# Patient Record
Sex: Male | Born: 2000 | Race: White | Hispanic: No | Marital: Single | Smoking: Never smoker
Health system: Southern US, Community
[De-identification: ages and names within clinical notes are randomized; demographics above are authoritative.]

## PROBLEM LIST (undated history)

## (undated) DIAGNOSIS — S060XAA Concussion with loss of consciousness status unknown, initial encounter: Secondary | ICD-10-CM

## (undated) DIAGNOSIS — S060X9A Concussion with loss of consciousness of unspecified duration, initial encounter: Secondary | ICD-10-CM

---
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
## 2022-08-17 DIAGNOSIS — S6991XA Unspecified injury of right wrist, hand and finger(s), initial encounter: Secondary | ICD-10-CM | POA: Diagnosis not present

## 2022-08-18 DIAGNOSIS — M79644 Pain in right finger(s): Secondary | ICD-10-CM | POA: Diagnosis not present

## 2022-08-31 DIAGNOSIS — M79644 Pain in right finger(s): Secondary | ICD-10-CM | POA: Diagnosis not present

## 2022-09-28 DIAGNOSIS — B349 Viral infection, unspecified: Secondary | ICD-10-CM | POA: Diagnosis not present

## 2022-10-15 DIAGNOSIS — F411 Generalized anxiety disorder: Secondary | ICD-10-CM | POA: Diagnosis not present

## 2022-10-15 DIAGNOSIS — G479 Sleep disorder, unspecified: Secondary | ICD-10-CM | POA: Diagnosis not present

## 2022-10-15 DIAGNOSIS — R41840 Attention and concentration deficit: Secondary | ICD-10-CM | POA: Diagnosis not present

## 2022-10-15 DIAGNOSIS — Z23 Encounter for immunization: Secondary | ICD-10-CM | POA: Diagnosis not present

## 2022-11-05 DIAGNOSIS — J101 Influenza due to other identified influenza virus with other respiratory manifestations: Secondary | ICD-10-CM | POA: Diagnosis not present

## 2022-11-05 DIAGNOSIS — J069 Acute upper respiratory infection, unspecified: Secondary | ICD-10-CM | POA: Diagnosis not present

## 2022-11-05 DIAGNOSIS — Z03818 Encounter for observation for suspected exposure to other biological agents ruled out: Secondary | ICD-10-CM | POA: Diagnosis not present

## 2022-11-19 DIAGNOSIS — J208 Acute bronchitis due to other specified organisms: Secondary | ICD-10-CM | POA: Diagnosis not present

## 2022-11-26 DIAGNOSIS — G479 Sleep disorder, unspecified: Secondary | ICD-10-CM | POA: Diagnosis not present

## 2022-11-26 DIAGNOSIS — R41840 Attention and concentration deficit: Secondary | ICD-10-CM | POA: Diagnosis not present

## 2022-11-26 DIAGNOSIS — Z13220 Encounter for screening for lipoid disorders: Secondary | ICD-10-CM | POA: Diagnosis not present

## 2022-11-26 DIAGNOSIS — F411 Generalized anxiety disorder: Secondary | ICD-10-CM | POA: Diagnosis not present

## 2022-11-26 DIAGNOSIS — Z0000 Encounter for general adult medical examination without abnormal findings: Secondary | ICD-10-CM | POA: Diagnosis not present

## 2022-11-26 DIAGNOSIS — R059 Cough, unspecified: Secondary | ICD-10-CM | POA: Diagnosis not present

## 2022-12-28 DIAGNOSIS — Z79891 Long term (current) use of opiate analgesic: Secondary | ICD-10-CM | POA: Diagnosis not present

## 2022-12-28 DIAGNOSIS — Z79899 Other long term (current) drug therapy: Secondary | ICD-10-CM | POA: Diagnosis not present

## 2022-12-28 DIAGNOSIS — Z5181 Encounter for therapeutic drug level monitoring: Secondary | ICD-10-CM | POA: Diagnosis not present

## 2022-12-28 DIAGNOSIS — R41840 Attention and concentration deficit: Secondary | ICD-10-CM | POA: Diagnosis not present

## 2022-12-31 DIAGNOSIS — F330 Major depressive disorder, recurrent, mild: Secondary | ICD-10-CM | POA: Diagnosis not present

## 2022-12-31 DIAGNOSIS — F902 Attention-deficit hyperactivity disorder, combined type: Secondary | ICD-10-CM | POA: Diagnosis not present

## 2022-12-31 DIAGNOSIS — F419 Anxiety disorder, unspecified: Secondary | ICD-10-CM | POA: Diagnosis not present

## 2023-01-04 DIAGNOSIS — R41840 Attention and concentration deficit: Secondary | ICD-10-CM | POA: Diagnosis not present

## 2023-01-04 DIAGNOSIS — Z79899 Other long term (current) drug therapy: Secondary | ICD-10-CM | POA: Diagnosis not present

## 2023-01-05 DIAGNOSIS — F429 Obsessive-compulsive disorder, unspecified: Secondary | ICD-10-CM | POA: Diagnosis not present

## 2023-01-07 DIAGNOSIS — F419 Anxiety disorder, unspecified: Secondary | ICD-10-CM | POA: Diagnosis not present

## 2023-01-07 DIAGNOSIS — F902 Attention-deficit hyperactivity disorder, combined type: Secondary | ICD-10-CM | POA: Diagnosis not present

## 2023-01-12 DIAGNOSIS — F429 Obsessive-compulsive disorder, unspecified: Secondary | ICD-10-CM | POA: Diagnosis not present

## 2023-01-12 DIAGNOSIS — F3181 Bipolar II disorder: Secondary | ICD-10-CM | POA: Diagnosis not present

## 2023-01-14 DIAGNOSIS — F902 Attention-deficit hyperactivity disorder, combined type: Secondary | ICD-10-CM | POA: Diagnosis not present

## 2023-01-14 DIAGNOSIS — F419 Anxiety disorder, unspecified: Secondary | ICD-10-CM | POA: Diagnosis not present

## 2023-01-21 DIAGNOSIS — F429 Obsessive-compulsive disorder, unspecified: Secondary | ICD-10-CM | POA: Diagnosis not present

## 2023-01-21 DIAGNOSIS — F3181 Bipolar II disorder: Secondary | ICD-10-CM | POA: Diagnosis not present

## 2023-01-21 DIAGNOSIS — F419 Anxiety disorder, unspecified: Secondary | ICD-10-CM | POA: Diagnosis not present

## 2023-01-21 DIAGNOSIS — F902 Attention-deficit hyperactivity disorder, combined type: Secondary | ICD-10-CM | POA: Diagnosis not present

## 2023-02-04 DIAGNOSIS — F3181 Bipolar II disorder: Secondary | ICD-10-CM | POA: Diagnosis not present

## 2023-02-04 DIAGNOSIS — F331 Major depressive disorder, recurrent, moderate: Secondary | ICD-10-CM | POA: Diagnosis not present

## 2023-02-04 DIAGNOSIS — F902 Attention-deficit hyperactivity disorder, combined type: Secondary | ICD-10-CM | POA: Diagnosis not present

## 2023-02-04 DIAGNOSIS — F429 Obsessive-compulsive disorder, unspecified: Secondary | ICD-10-CM | POA: Diagnosis not present

## 2023-02-06 DIAGNOSIS — S01112A Laceration without foreign body of left eyelid and periocular area, initial encounter: Secondary | ICD-10-CM | POA: Diagnosis not present

## 2023-02-08 DIAGNOSIS — F3181 Bipolar II disorder: Secondary | ICD-10-CM | POA: Diagnosis not present

## 2023-02-08 DIAGNOSIS — Z1331 Encounter for screening for depression: Secondary | ICD-10-CM | POA: Diagnosis not present

## 2023-02-08 DIAGNOSIS — F4310 Post-traumatic stress disorder, unspecified: Secondary | ICD-10-CM | POA: Diagnosis not present

## 2023-02-08 DIAGNOSIS — F429 Obsessive-compulsive disorder, unspecified: Secondary | ICD-10-CM | POA: Diagnosis not present

## 2023-02-18 DIAGNOSIS — F429 Obsessive-compulsive disorder, unspecified: Secondary | ICD-10-CM | POA: Diagnosis not present

## 2023-02-18 DIAGNOSIS — F3181 Bipolar II disorder: Secondary | ICD-10-CM | POA: Diagnosis not present

## 2023-02-18 DIAGNOSIS — F902 Attention-deficit hyperactivity disorder, combined type: Secondary | ICD-10-CM | POA: Diagnosis not present

## 2023-02-18 DIAGNOSIS — F4310 Post-traumatic stress disorder, unspecified: Secondary | ICD-10-CM | POA: Diagnosis not present

## 2023-02-25 DIAGNOSIS — F4310 Post-traumatic stress disorder, unspecified: Secondary | ICD-10-CM | POA: Diagnosis not present

## 2023-02-25 DIAGNOSIS — F3181 Bipolar II disorder: Secondary | ICD-10-CM | POA: Diagnosis not present

## 2023-02-25 DIAGNOSIS — F902 Attention-deficit hyperactivity disorder, combined type: Secondary | ICD-10-CM | POA: Diagnosis not present

## 2023-02-25 DIAGNOSIS — F429 Obsessive-compulsive disorder, unspecified: Secondary | ICD-10-CM | POA: Diagnosis not present

## 2023-03-04 DIAGNOSIS — F902 Attention-deficit hyperactivity disorder, combined type: Secondary | ICD-10-CM | POA: Diagnosis not present

## 2023-03-04 DIAGNOSIS — S91331A Puncture wound without foreign body, right foot, initial encounter: Secondary | ICD-10-CM | POA: Diagnosis not present

## 2023-03-04 DIAGNOSIS — F3181 Bipolar II disorder: Secondary | ICD-10-CM | POA: Diagnosis not present

## 2023-03-08 DIAGNOSIS — F429 Obsessive-compulsive disorder, unspecified: Secondary | ICD-10-CM | POA: Diagnosis not present

## 2023-03-08 DIAGNOSIS — F3181 Bipolar II disorder: Secondary | ICD-10-CM | POA: Diagnosis not present

## 2023-03-08 DIAGNOSIS — Z1331 Encounter for screening for depression: Secondary | ICD-10-CM | POA: Diagnosis not present

## 2023-03-08 DIAGNOSIS — F4310 Post-traumatic stress disorder, unspecified: Secondary | ICD-10-CM | POA: Diagnosis not present

## 2023-03-11 DIAGNOSIS — F331 Major depressive disorder, recurrent, moderate: Secondary | ICD-10-CM | POA: Diagnosis not present

## 2023-03-11 DIAGNOSIS — F902 Attention-deficit hyperactivity disorder, combined type: Secondary | ICD-10-CM | POA: Diagnosis not present

## 2023-03-11 DIAGNOSIS — F429 Obsessive-compulsive disorder, unspecified: Secondary | ICD-10-CM | POA: Diagnosis not present

## 2023-03-18 DIAGNOSIS — F3181 Bipolar II disorder: Secondary | ICD-10-CM | POA: Diagnosis not present

## 2023-03-18 DIAGNOSIS — F902 Attention-deficit hyperactivity disorder, combined type: Secondary | ICD-10-CM | POA: Diagnosis not present

## 2023-03-18 DIAGNOSIS — F429 Obsessive-compulsive disorder, unspecified: Secondary | ICD-10-CM | POA: Diagnosis not present

## 2023-03-18 DIAGNOSIS — F4310 Post-traumatic stress disorder, unspecified: Secondary | ICD-10-CM | POA: Diagnosis not present

## 2023-03-22 DIAGNOSIS — R030 Elevated blood-pressure reading, without diagnosis of hypertension: Secondary | ICD-10-CM | POA: Diagnosis not present

## 2023-03-22 DIAGNOSIS — L089 Local infection of the skin and subcutaneous tissue, unspecified: Secondary | ICD-10-CM | POA: Diagnosis not present

## 2023-03-23 DIAGNOSIS — L03115 Cellulitis of right lower limb: Secondary | ICD-10-CM | POA: Diagnosis not present

## 2023-03-25 DIAGNOSIS — F4310 Post-traumatic stress disorder, unspecified: Secondary | ICD-10-CM | POA: Diagnosis not present

## 2023-03-25 DIAGNOSIS — F902 Attention-deficit hyperactivity disorder, combined type: Secondary | ICD-10-CM | POA: Diagnosis not present

## 2023-03-25 DIAGNOSIS — F429 Obsessive-compulsive disorder, unspecified: Secondary | ICD-10-CM | POA: Diagnosis not present

## 2023-03-25 DIAGNOSIS — F3181 Bipolar II disorder: Secondary | ICD-10-CM | POA: Diagnosis not present

## 2023-04-01 DIAGNOSIS — F3181 Bipolar II disorder: Secondary | ICD-10-CM | POA: Diagnosis not present

## 2023-04-01 DIAGNOSIS — F902 Attention-deficit hyperactivity disorder, combined type: Secondary | ICD-10-CM | POA: Diagnosis not present

## 2023-04-01 DIAGNOSIS — F429 Obsessive-compulsive disorder, unspecified: Secondary | ICD-10-CM | POA: Diagnosis not present

## 2023-04-01 DIAGNOSIS — F4310 Post-traumatic stress disorder, unspecified: Secondary | ICD-10-CM | POA: Diagnosis not present

## 2023-04-02 DIAGNOSIS — M9902 Segmental and somatic dysfunction of thoracic region: Secondary | ICD-10-CM | POA: Diagnosis not present

## 2023-04-02 DIAGNOSIS — M7651 Patellar tendinitis, right knee: Secondary | ICD-10-CM | POA: Diagnosis not present

## 2023-04-02 DIAGNOSIS — M9905 Segmental and somatic dysfunction of pelvic region: Secondary | ICD-10-CM | POA: Diagnosis not present

## 2023-04-02 DIAGNOSIS — M9903 Segmental and somatic dysfunction of lumbar region: Secondary | ICD-10-CM | POA: Diagnosis not present

## 2023-04-02 DIAGNOSIS — M7661 Achilles tendinitis, right leg: Secondary | ICD-10-CM | POA: Diagnosis not present

## 2023-04-02 DIAGNOSIS — M4607 Spinal enthesopathy, lumbosacral region: Secondary | ICD-10-CM | POA: Diagnosis not present

## 2023-04-06 DIAGNOSIS — F3181 Bipolar II disorder: Secondary | ICD-10-CM | POA: Diagnosis not present

## 2023-04-06 DIAGNOSIS — F4310 Post-traumatic stress disorder, unspecified: Secondary | ICD-10-CM | POA: Diagnosis not present

## 2023-04-06 DIAGNOSIS — Z1331 Encounter for screening for depression: Secondary | ICD-10-CM | POA: Diagnosis not present

## 2023-04-06 DIAGNOSIS — F429 Obsessive-compulsive disorder, unspecified: Secondary | ICD-10-CM | POA: Diagnosis not present

## 2023-04-07 DIAGNOSIS — M4607 Spinal enthesopathy, lumbosacral region: Secondary | ICD-10-CM | POA: Diagnosis not present

## 2023-04-07 DIAGNOSIS — M9906 Segmental and somatic dysfunction of lower extremity: Secondary | ICD-10-CM | POA: Diagnosis not present

## 2023-04-07 DIAGNOSIS — M9903 Segmental and somatic dysfunction of lumbar region: Secondary | ICD-10-CM | POA: Diagnosis not present

## 2023-04-07 DIAGNOSIS — M9905 Segmental and somatic dysfunction of pelvic region: Secondary | ICD-10-CM | POA: Diagnosis not present

## 2023-04-07 DIAGNOSIS — M9902 Segmental and somatic dysfunction of thoracic region: Secondary | ICD-10-CM | POA: Diagnosis not present

## 2023-04-08 DIAGNOSIS — F429 Obsessive-compulsive disorder, unspecified: Secondary | ICD-10-CM | POA: Diagnosis not present

## 2023-04-08 DIAGNOSIS — F902 Attention-deficit hyperactivity disorder, combined type: Secondary | ICD-10-CM | POA: Diagnosis not present

## 2023-04-08 DIAGNOSIS — F4310 Post-traumatic stress disorder, unspecified: Secondary | ICD-10-CM | POA: Diagnosis not present

## 2023-04-08 DIAGNOSIS — F3181 Bipolar II disorder: Secondary | ICD-10-CM | POA: Diagnosis not present

## 2023-04-15 DIAGNOSIS — F4310 Post-traumatic stress disorder, unspecified: Secondary | ICD-10-CM | POA: Diagnosis not present

## 2023-04-15 DIAGNOSIS — F429 Obsessive-compulsive disorder, unspecified: Secondary | ICD-10-CM | POA: Diagnosis not present

## 2023-04-15 DIAGNOSIS — F3181 Bipolar II disorder: Secondary | ICD-10-CM | POA: Diagnosis not present

## 2023-04-15 DIAGNOSIS — F902 Attention-deficit hyperactivity disorder, combined type: Secondary | ICD-10-CM | POA: Diagnosis not present

## 2023-04-15 DIAGNOSIS — L02415 Cutaneous abscess of right lower limb: Secondary | ICD-10-CM | POA: Diagnosis not present

## 2023-04-15 DIAGNOSIS — Z22322 Carrier or suspected carrier of Methicillin resistant Staphylococcus aureus: Secondary | ICD-10-CM | POA: Diagnosis not present

## 2023-04-16 DIAGNOSIS — M9906 Segmental and somatic dysfunction of lower extremity: Secondary | ICD-10-CM | POA: Diagnosis not present

## 2023-04-16 DIAGNOSIS — M9903 Segmental and somatic dysfunction of lumbar region: Secondary | ICD-10-CM | POA: Diagnosis not present

## 2023-04-16 DIAGNOSIS — M4607 Spinal enthesopathy, lumbosacral region: Secondary | ICD-10-CM | POA: Diagnosis not present

## 2023-04-16 DIAGNOSIS — M9902 Segmental and somatic dysfunction of thoracic region: Secondary | ICD-10-CM | POA: Diagnosis not present

## 2023-04-16 DIAGNOSIS — M9905 Segmental and somatic dysfunction of pelvic region: Secondary | ICD-10-CM | POA: Diagnosis not present

## 2023-04-22 DIAGNOSIS — F902 Attention-deficit hyperactivity disorder, combined type: Secondary | ICD-10-CM | POA: Diagnosis not present

## 2023-04-22 DIAGNOSIS — F429 Obsessive-compulsive disorder, unspecified: Secondary | ICD-10-CM | POA: Diagnosis not present

## 2023-04-22 DIAGNOSIS — F3181 Bipolar II disorder: Secondary | ICD-10-CM | POA: Diagnosis not present

## 2023-04-23 DIAGNOSIS — M9905 Segmental and somatic dysfunction of pelvic region: Secondary | ICD-10-CM | POA: Diagnosis not present

## 2023-04-23 DIAGNOSIS — M9906 Segmental and somatic dysfunction of lower extremity: Secondary | ICD-10-CM | POA: Diagnosis not present

## 2023-04-23 DIAGNOSIS — M9903 Segmental and somatic dysfunction of lumbar region: Secondary | ICD-10-CM | POA: Diagnosis not present

## 2023-04-23 DIAGNOSIS — M9901 Segmental and somatic dysfunction of cervical region: Secondary | ICD-10-CM | POA: Diagnosis not present

## 2023-04-23 DIAGNOSIS — M4607 Spinal enthesopathy, lumbosacral region: Secondary | ICD-10-CM | POA: Diagnosis not present

## 2023-04-23 DIAGNOSIS — M9902 Segmental and somatic dysfunction of thoracic region: Secondary | ICD-10-CM | POA: Diagnosis not present

## 2023-04-27 DIAGNOSIS — F411 Generalized anxiety disorder: Secondary | ICD-10-CM | POA: Diagnosis not present

## 2023-04-27 DIAGNOSIS — F515 Nightmare disorder: Secondary | ICD-10-CM | POA: Diagnosis not present

## 2023-04-27 DIAGNOSIS — F319 Bipolar disorder, unspecified: Secondary | ICD-10-CM | POA: Diagnosis not present

## 2023-04-29 DIAGNOSIS — F3181 Bipolar II disorder: Secondary | ICD-10-CM | POA: Diagnosis not present

## 2023-04-29 DIAGNOSIS — F4310 Post-traumatic stress disorder, unspecified: Secondary | ICD-10-CM | POA: Diagnosis not present

## 2023-04-29 DIAGNOSIS — R944 Abnormal results of kidney function studies: Secondary | ICD-10-CM | POA: Diagnosis not present

## 2023-04-29 DIAGNOSIS — F429 Obsessive-compulsive disorder, unspecified: Secondary | ICD-10-CM | POA: Diagnosis not present

## 2023-04-29 DIAGNOSIS — F902 Attention-deficit hyperactivity disorder, combined type: Secondary | ICD-10-CM | POA: Diagnosis not present

## 2023-05-03 DIAGNOSIS — F429 Obsessive-compulsive disorder, unspecified: Secondary | ICD-10-CM | POA: Diagnosis not present

## 2023-05-03 DIAGNOSIS — F3181 Bipolar II disorder: Secondary | ICD-10-CM | POA: Diagnosis not present

## 2023-05-03 DIAGNOSIS — F4310 Post-traumatic stress disorder, unspecified: Secondary | ICD-10-CM | POA: Diagnosis not present

## 2023-05-07 DIAGNOSIS — M9903 Segmental and somatic dysfunction of lumbar region: Secondary | ICD-10-CM | POA: Diagnosis not present

## 2023-05-07 DIAGNOSIS — M9902 Segmental and somatic dysfunction of thoracic region: Secondary | ICD-10-CM | POA: Diagnosis not present

## 2023-05-07 DIAGNOSIS — M4607 Spinal enthesopathy, lumbosacral region: Secondary | ICD-10-CM | POA: Diagnosis not present

## 2023-05-07 DIAGNOSIS — M9905 Segmental and somatic dysfunction of pelvic region: Secondary | ICD-10-CM | POA: Diagnosis not present

## 2023-05-27 DIAGNOSIS — F3181 Bipolar II disorder: Secondary | ICD-10-CM | POA: Diagnosis not present

## 2023-05-27 DIAGNOSIS — F902 Attention-deficit hyperactivity disorder, combined type: Secondary | ICD-10-CM | POA: Diagnosis not present

## 2023-05-27 DIAGNOSIS — F4310 Post-traumatic stress disorder, unspecified: Secondary | ICD-10-CM | POA: Diagnosis not present

## 2023-05-27 DIAGNOSIS — F429 Obsessive-compulsive disorder, unspecified: Secondary | ICD-10-CM | POA: Diagnosis not present

## 2023-05-31 DIAGNOSIS — L02415 Cutaneous abscess of right lower limb: Secondary | ICD-10-CM | POA: Diagnosis not present

## 2023-06-10 DIAGNOSIS — F429 Obsessive-compulsive disorder, unspecified: Secondary | ICD-10-CM | POA: Diagnosis not present

## 2023-06-10 DIAGNOSIS — F3181 Bipolar II disorder: Secondary | ICD-10-CM | POA: Diagnosis not present

## 2023-06-10 DIAGNOSIS — F902 Attention-deficit hyperactivity disorder, combined type: Secondary | ICD-10-CM | POA: Diagnosis not present

## 2023-06-23 DIAGNOSIS — R5383 Other fatigue: Secondary | ICD-10-CM | POA: Diagnosis not present

## 2023-06-23 DIAGNOSIS — R748 Abnormal levels of other serum enzymes: Secondary | ICD-10-CM | POA: Diagnosis not present

## 2023-06-24 DIAGNOSIS — F902 Attention-deficit hyperactivity disorder, combined type: Secondary | ICD-10-CM | POA: Diagnosis not present

## 2023-06-24 DIAGNOSIS — F429 Obsessive-compulsive disorder, unspecified: Secondary | ICD-10-CM | POA: Diagnosis not present

## 2023-06-24 DIAGNOSIS — F4310 Post-traumatic stress disorder, unspecified: Secondary | ICD-10-CM | POA: Diagnosis not present

## 2023-06-24 DIAGNOSIS — F3181 Bipolar II disorder: Secondary | ICD-10-CM | POA: Diagnosis not present

## 2023-07-07 ENCOUNTER — Ambulatory Visit: Payer: BC Managed Care – PPO | Admitting: Podiatry

## 2023-07-07 ENCOUNTER — Ambulatory Visit: Payer: BC Managed Care – PPO

## 2023-07-07 ENCOUNTER — Encounter: Payer: Self-pay | Admitting: Podiatry

## 2023-07-07 DIAGNOSIS — M722 Plantar fascial fibromatosis: Secondary | ICD-10-CM

## 2023-07-07 DIAGNOSIS — M778 Other enthesopathies, not elsewhere classified: Secondary | ICD-10-CM

## 2023-07-07 NOTE — Progress Notes (Signed)
  Subjective:  Patient ID: Gilbert Wong, male    DOB: 2001/11/11,  MRN: [id number]  Chief Complaint  Patient presents with   Foot Pain    RM11: patient is here for right foot pain, hurts when flexed thinks something is in bottom of foot    22 y.o. male presents with the above complaint.  Patient presents with right plantar midfoot plantar fasciitis pain.  Patient states that this came out of nowhere has been hurting him.  Pain scale is 5 out of 10.  He works a lot on his foot.  He wears 2 toe boots.  Denies any other acute complaints   Review of Systems: Negative except as noted in the HPI. Denies N/V/F/Ch.  Past Medical History:  Diagnosis Date   Concussion     Current Outpatient Medications:    aspirin-acetaminophen-caffeine (EXCEDRIN MIGRAINE) 250-250-65 MG tablet, Take 1 tablet by mouth every 6 (six) hours as needed for headache., Disp: , Rfl:   Social History   Tobacco Use  Smoking Status Never  Smokeless Tobacco Never    No Known Allergies Objective:  There were no vitals filed for this visit. There is no height or weight on file to calculate BMI. Constitutional Well developed. Well nourished.  Vascular Dorsalis pedis pulses palpable bilaterally. Posterior tibial pulses palpable bilaterally. Capillary refill normal to all digits.  No cyanosis or clubbing noted. Pedal hair growth normal.  Neurologic Normal speech. Oriented to person, place, and time. Epicritic sensation to light touch grossly present bilaterally.  Dermatologic Nails well groomed and normal in appearance. No open wounds. No skin lesions.  Orthopedic: Normal joint ROM without pain or crepitus bilaterally. No visible deformities. Tender to palpation at the calcaneal tuber right. No pain with calcaneal squeeze right. Ankle ROM diminished range of motion right. Silfverskiold Test: positive right.   Radiographs: None  Assessment:   1. Plantar fasciitis of right foot    Plan:  Patient was  evaluated and treated and all questions answered.  Plantar Fasciitis, right - XR reviewed as above.  - Educated on icing and stretching. Instructions given.  - Injection delivered to the plantar fascia as below. - DME: Plantar fascial brace dispensed to support the medial longitudinal arch of the foot and offload pressure from the heel and prevent arch collapse during weightbearing - Pharmacologic management: None  Procedure: Injection Tendon/Ligament Location: Right plantar fascia at the glabrous junction; medial approach. Skin Prep: alcohol Injectate: 0.5 cc 0.5% marcaine plain, 0.5 cc of 1% Lidocaine, 0.5 cc kenalog 10. Disposition: Patient tolerated procedure well. Injection site dressed with a band-aid.  No follow-ups on file.

## 2023-07-08 DIAGNOSIS — F3181 Bipolar II disorder: Secondary | ICD-10-CM | POA: Diagnosis not present

## 2023-07-08 DIAGNOSIS — F429 Obsessive-compulsive disorder, unspecified: Secondary | ICD-10-CM | POA: Diagnosis not present

## 2023-07-08 DIAGNOSIS — F902 Attention-deficit hyperactivity disorder, combined type: Secondary | ICD-10-CM | POA: Diagnosis not present

## 2023-07-08 DIAGNOSIS — F4310 Post-traumatic stress disorder, unspecified: Secondary | ICD-10-CM | POA: Diagnosis not present

## 2023-07-22 DIAGNOSIS — F902 Attention-deficit hyperactivity disorder, combined type: Secondary | ICD-10-CM | POA: Diagnosis not present

## 2023-07-22 DIAGNOSIS — F429 Obsessive-compulsive disorder, unspecified: Secondary | ICD-10-CM | POA: Diagnosis not present

## 2023-07-22 DIAGNOSIS — F4310 Post-traumatic stress disorder, unspecified: Secondary | ICD-10-CM | POA: Diagnosis not present

## 2023-07-22 DIAGNOSIS — F3181 Bipolar II disorder: Secondary | ICD-10-CM | POA: Diagnosis not present

## 2023-08-05 DIAGNOSIS — F429 Obsessive-compulsive disorder, unspecified: Secondary | ICD-10-CM | POA: Diagnosis not present

## 2023-08-05 DIAGNOSIS — F902 Attention-deficit hyperactivity disorder, combined type: Secondary | ICD-10-CM | POA: Diagnosis not present

## 2023-08-05 DIAGNOSIS — F3181 Bipolar II disorder: Secondary | ICD-10-CM | POA: Diagnosis not present

## 2023-08-05 DIAGNOSIS — F4310 Post-traumatic stress disorder, unspecified: Secondary | ICD-10-CM | POA: Diagnosis not present

## 2023-08-06 ENCOUNTER — Encounter: Payer: Self-pay | Admitting: Podiatry

## 2023-08-06 ENCOUNTER — Ambulatory Visit: Payer: BC Managed Care – PPO | Admitting: Podiatry

## 2023-08-06 DIAGNOSIS — M722 Plantar fascial fibromatosis: Secondary | ICD-10-CM | POA: Diagnosis not present

## 2023-08-06 DIAGNOSIS — Q666 Other congenital valgus deformities of feet: Secondary | ICD-10-CM

## 2023-08-06 NOTE — Progress Notes (Signed)
Subjective:  Patient ID: Gilbert Wong, male    DOB: 2001-06-25,  MRN: [id number]  Chief Complaint  Patient presents with   Plantar Fasciitis    Pt stated that it is doing much better the injection helped a lot     22 y.o. male presents with the above complaint.  Patient presents with right plantar midfoot plantar fasciitis pain.  He states doing a lot better.  He no longer is any pain he is 100% resolved.  He would like to discuss orthotics   Review of Systems: Negative except as noted in the HPI. Denies N/V/F/Ch.  Past Medical History:  Diagnosis Date   Concussion     Current Outpatient Medications:    aspirin-acetaminophen-caffeine (EXCEDRIN MIGRAINE) 250-250-65 MG tablet, Take 1 tablet by mouth every 6 (six) hours as needed for headache., Disp: , Rfl:   Social History   Tobacco Use  Smoking Status Never  Smokeless Tobacco Never    No Known Allergies Objective:  There were no vitals filed for this visit. There is no height or weight on file to calculate BMI. Constitutional Well developed. Well nourished.  Vascular Dorsalis pedis pulses palpable bilaterally. Posterior tibial pulses palpable bilaterally. Capillary refill normal to all digits.  No cyanosis or clubbing noted. Pedal hair growth normal.  Neurologic Normal speech. Oriented to person, place, and time. Epicritic sensation to light touch grossly present bilaterally.  Dermatologic Nails well groomed and normal in appearance. No open wounds. No skin lesions.  Orthopedic: Normal joint ROM without pain or crepitus bilaterally. No visible deformities. No further tender to palpation at the calcaneal tuber right. No pain with calcaneal squeeze right. Ankle ROM diminished range of motion right. Silfverskiold Test: positive right.   Radiographs: None  Assessment:   1. Pes planovalgus   2. Plantar fasciitis of right foot     Plan:  Patient was evaluated and treated and all questions  answered.  Plantar Fasciitis, right -Clinically healed and officially discharged from my care.  I discussed that if it reoccurs to come see me right away.  He states understanding also discussed shoe gear modification orthotics.  He will obtain orthotics  Pes planovalgus -I explained to patient the etiology of pes planovalgus and relationship with Planter fasciitis and various treatment options were discussed.  Given patient foot structure in the setting of Planter fasciitis I believe patient will benefit from custom-made orthotics to help control the hindfoot motion support the arch of the foot and take the stress away from plantar fascial.  Patient agrees with the plan like to proceed with orthotics -Patient was casted for orthotics .  No follow-ups on file.

## 2023-08-16 NOTE — Progress Notes (Signed)
Order for foot orthotics placed  Items to be fit when in  [employer], CFo, CFm

## 2023-09-02 DIAGNOSIS — F3181 Bipolar II disorder: Secondary | ICD-10-CM | POA: Diagnosis not present

## 2023-09-02 DIAGNOSIS — F429 Obsessive-compulsive disorder, unspecified: Secondary | ICD-10-CM | POA: Diagnosis not present

## 2023-09-02 DIAGNOSIS — F902 Attention-deficit hyperactivity disorder, combined type: Secondary | ICD-10-CM | POA: Diagnosis not present

## 2023-09-14 ENCOUNTER — Other Ambulatory Visit: Payer: BC Managed Care – PPO

## 2023-09-23 DIAGNOSIS — F902 Attention-deficit hyperactivity disorder, combined type: Secondary | ICD-10-CM | POA: Diagnosis not present

## 2023-09-23 DIAGNOSIS — F3181 Bipolar II disorder: Secondary | ICD-10-CM | POA: Diagnosis not present

## 2023-09-23 DIAGNOSIS — F429 Obsessive-compulsive disorder, unspecified: Secondary | ICD-10-CM | POA: Diagnosis not present

## 2023-09-23 DIAGNOSIS — F4310 Post-traumatic stress disorder, unspecified: Secondary | ICD-10-CM | POA: Diagnosis not present

## 2023-09-30 DIAGNOSIS — F4310 Post-traumatic stress disorder, unspecified: Secondary | ICD-10-CM | POA: Diagnosis not present

## 2023-09-30 DIAGNOSIS — F902 Attention-deficit hyperactivity disorder, combined type: Secondary | ICD-10-CM | POA: Diagnosis not present

## 2023-09-30 DIAGNOSIS — F3181 Bipolar II disorder: Secondary | ICD-10-CM | POA: Diagnosis not present

## 2023-09-30 DIAGNOSIS — F429 Obsessive-compulsive disorder, unspecified: Secondary | ICD-10-CM | POA: Diagnosis not present

## 2023-10-05 DIAGNOSIS — Z6827 Body mass index (BMI) 27.0-27.9, adult: Secondary | ICD-10-CM | POA: Diagnosis not present

## 2023-10-05 DIAGNOSIS — L600 Ingrowing nail: Secondary | ICD-10-CM | POA: Diagnosis not present

## 2023-10-05 DIAGNOSIS — R030 Elevated blood-pressure reading, without diagnosis of hypertension: Secondary | ICD-10-CM | POA: Diagnosis not present

## 2023-10-07 ENCOUNTER — Ambulatory Visit: Payer: BC Managed Care – PPO

## 2023-10-07 NOTE — Progress Notes (Signed)
Patient presents today to pick up custom molded foot orthotics, diagnosed with Pes Planus by Dr. Allena Katz.   Orthotics were dispensed and fit was satisfactory. Reviewed instructions for break-in and wear. Written instructions given to patient.  Patient will follow up as needed.   [name] Cped, CFo, CFm

## 2023-11-05 DIAGNOSIS — F3181 Bipolar II disorder: Secondary | ICD-10-CM | POA: Diagnosis not present

## 2023-11-05 DIAGNOSIS — F4310 Post-traumatic stress disorder, unspecified: Secondary | ICD-10-CM | POA: Diagnosis not present

## 2023-11-05 DIAGNOSIS — F902 Attention-deficit hyperactivity disorder, combined type: Secondary | ICD-10-CM | POA: Diagnosis not present

## 2023-11-05 DIAGNOSIS — F429 Obsessive-compulsive disorder, unspecified: Secondary | ICD-10-CM | POA: Diagnosis not present
